# Patient Record
Sex: Female | Born: 1975 | Hispanic: No | Smoking: Never smoker
Health system: Southern US, Community
[De-identification: ages and names within clinical notes are randomized; demographics above are authoritative.]

## PROBLEM LIST (undated history)

## (undated) DIAGNOSIS — I10 Essential (primary) hypertension: Secondary | ICD-10-CM

## (undated) HISTORY — PX: TUBAL LIGATION: SHX77

---

## 2007-04-22 ENCOUNTER — Encounter: Admission: RE | Admit: 2007-04-22 | Discharge: 2007-04-22 | Payer: Self-pay | Admitting: Family Medicine

## 2007-04-27 ENCOUNTER — Other Ambulatory Visit: Admission: RE | Admit: 2007-04-27 | Discharge: 2007-04-27 | Payer: Self-pay | Admitting: Gynecology

## 2010-04-10 ENCOUNTER — Other Ambulatory Visit (HOSPITAL_COMMUNITY)
Admission: RE | Admit: 2010-04-10 | Discharge: 2010-04-10 | Disposition: A | Discharge status: Home / Self Care | Payer: BC Managed Care – PPO | Source: Ambulatory Visit | Attending: Family Medicine | Admitting: Family Medicine

## 2010-04-10 ENCOUNTER — Other Ambulatory Visit: Payer: Self-pay | Admitting: Family Medicine

## 2010-04-10 DIAGNOSIS — Z124 Encounter for screening for malignant neoplasm of cervix: Secondary | ICD-10-CM | POA: Insufficient documentation

## 2010-04-10 DIAGNOSIS — Z1159 Encounter for screening for other viral diseases: Secondary | ICD-10-CM | POA: Insufficient documentation

## 2013-05-09 ENCOUNTER — Other Ambulatory Visit (HOSPITAL_COMMUNITY)
Admission: RE | Admit: 2013-05-09 | Discharge: 2013-05-09 | Disposition: A | Discharge status: Home / Self Care | Payer: BC Managed Care – PPO | Source: Ambulatory Visit | Attending: Family Medicine | Admitting: Family Medicine

## 2013-05-09 ENCOUNTER — Other Ambulatory Visit: Payer: Self-pay | Admitting: Family Medicine

## 2013-05-09 DIAGNOSIS — Z01419 Encounter for gynecological examination (general) (routine) without abnormal findings: Secondary | ICD-10-CM | POA: Insufficient documentation

## 2013-05-09 DIAGNOSIS — Z1151 Encounter for screening for human papillomavirus (HPV): Secondary | ICD-10-CM | POA: Insufficient documentation

## 2013-10-03 ENCOUNTER — Emergency Department (HOSPITAL_BASED_OUTPATIENT_CLINIC_OR_DEPARTMENT_OTHER)
Admission: EM | Admit: 2013-10-03 | Discharge: 2013-10-03 | Disposition: A | Discharge status: Home / Self Care | Payer: BC Managed Care – PPO | Attending: Emergency Medicine | Admitting: Emergency Medicine

## 2013-10-03 ENCOUNTER — Emergency Department (HOSPITAL_BASED_OUTPATIENT_CLINIC_OR_DEPARTMENT_OTHER): Payer: BC Managed Care – PPO

## 2013-10-03 ENCOUNTER — Encounter (HOSPITAL_BASED_OUTPATIENT_CLINIC_OR_DEPARTMENT_OTHER): Payer: Self-pay | Admitting: Emergency Medicine

## 2013-10-03 DIAGNOSIS — Z3202 Encounter for pregnancy test, result negative: Secondary | ICD-10-CM | POA: Insufficient documentation

## 2013-10-03 DIAGNOSIS — I10 Essential (primary) hypertension: Secondary | ICD-10-CM | POA: Insufficient documentation

## 2013-10-03 DIAGNOSIS — Z79899 Other long term (current) drug therapy: Secondary | ICD-10-CM | POA: Insufficient documentation

## 2013-10-03 DIAGNOSIS — R102 Pelvic and perineal pain: Secondary | ICD-10-CM

## 2013-10-03 DIAGNOSIS — R109 Unspecified abdominal pain: Secondary | ICD-10-CM | POA: Insufficient documentation

## 2013-10-03 DIAGNOSIS — N949 Unspecified condition associated with female genital organs and menstrual cycle: Secondary | ICD-10-CM | POA: Insufficient documentation

## 2013-10-03 HISTORY — DX: Essential (primary) hypertension: I10

## 2013-10-03 LAB — URINALYSIS, ROUTINE W REFLEX MICROSCOPIC
Bilirubin Urine: NEGATIVE
Glucose, UA: NEGATIVE mg/dL
Hgb urine dipstick: NEGATIVE
KETONES UR: NEGATIVE mg/dL
LEUKOCYTES UA: NEGATIVE
Nitrite: NEGATIVE
PROTEIN: NEGATIVE mg/dL
Specific Gravity, Urine: 1.018 (ref 1.005–1.030)
UROBILINOGEN UA: 0.2 mg/dL (ref 0.0–1.0)
pH: 6 (ref 5.0–8.0)

## 2013-10-03 LAB — PREGNANCY, URINE: PREG TEST UR: NEGATIVE

## 2013-10-03 MED ORDER — KETOROLAC TROMETHAMINE 60 MG/2ML IM SOLN
INTRAMUSCULAR | Status: AC
  Administered 2013-10-03: 60 mg via INTRAMUSCULAR
  Filled 2013-10-03: qty 2

## 2013-10-03 MED ORDER — KETOROLAC TROMETHAMINE 60 MG/2ML IM SOLN
60.0000 mg | Freq: Once | INTRAMUSCULAR | Status: AC
  Administered 2013-10-03: 60 mg via INTRAMUSCULAR

## 2013-10-03 NOTE — Discharge Instructions (Signed)
Call women's outpatient clinic to arrange a followup appointment. The contact information has been provided in this discharge summary.  Return to the emergency department if you develop worsening pain, high fever , or other new and concerning symptoms.   Pelvic Pain Female pelvic pain can be caused by many different things and start from a variety of places. Pelvic pain refers to pain that is located in the lower half of the abdomen and between your hips. The pain may occur over a short period of time (acute) or may be reoccurring (chronic). The cause of pelvic pain may be related to disorders affecting the female reproductive organs (gynecologic), but it may also be related to the bladder, kidney stones, an intestinal complication, or muscle or skeletal problems. Getting help right away for pelvic pain is important, especially if there has been severe, sharp, or a sudden onset of unusual pain. It is also important to get help right away because some types of pelvic pain can be life threatening.  CAUSES  Below are only some of the causes of pelvic pain. The causes of pelvic pain can be in one of several categories.   Gynecologic.  Pelvic inflammatory disease.  Sexually transmitted infection.  Ovarian cyst or a twisted ovarian ligament (ovarian torsion).  Uterine lining that grows outside the uterus (endometriosis).  Fibroids, cysts, or tumors.  Ovulation.  Pregnancy.  Pregnancy that occurs outside the uterus (ectopic pregnancy).  Miscarriage.  Labor.  Abruption of the placenta or ruptured uterus.  Infection.  Uterine infection (endometritis).  Bladder infection.  Diverticulitis.  Miscarriage related to a uterine infection (septic abortion).  Bladder.  Inflammation of the bladder (cystitis).  Kidney stone(s).  Gastrointestinal.  Constipation.  Diverticulitis.  Neurologic.  Trauma.  Feeling pelvic pain because of mental or emotional causes  (psychosomatic).  Cancers of the bowel or pelvis. EVALUATION  Your caregiver will want to take a careful history of your concerns. This includes recent changes in your health, a careful gynecologic history of your periods (menses), and a sexual history. Obtaining your family history and medical history is also important. Your caregiver may suggest a pelvic exam. A pelvic exam will help identify the location and severity of the pain. It also helps in the evaluation of which organ system may be involved. In order to identify the cause of the pelvic pain and be properly treated, your caregiver may order tests. These tests may include:   A pregnancy test.  Pelvic ultrasonography.  An X-ray exam of the abdomen.  A urinalysis or evaluation of vaginal discharge.  Blood tests. HOME CARE INSTRUCTIONS   Only take over-the-counter or prescription medicines for pain, discomfort, or fever as directed by your caregiver.   Rest as directed by your caregiver.   Eat a balanced diet.   Drink enough fluids to make your urine clear or pale yellow, or as directed.   Avoid sexual intercourse if it causes pain.   Apply warm or cold compresses to the lower abdomen depending on which one helps the pain.   Avoid stressful situations.   Keep a journal of your pelvic pain. Write down when it started, where the pain is located, and if there are things that seem to be associated with the pain, such as food or your menstrual cycle.  Follow up with your caregiver as directed.  SEEK MEDICAL CARE IF:  Your medicine does not help your pain.  You have abnormal vaginal discharge. SEEK IMMEDIATE MEDICAL CARE IF:   You have  heavy bleeding from the vagina.   Your pelvic pain increases.   You feel light-headed or faint.   You have chills.   You have pain with urination or blood in your urine.   You have uncontrolled diarrhea or vomiting.   You have a fever or persistent symptoms for more  than 3 days.  You have a fever and your symptoms suddenly get worse.   You are being physically or sexually abused.  MAKE SURE YOU:  Understand these instructions.  Will watch your condition.  Will get help if you are not doing well or get worse. Document Released: 01/14/2004 Document Revised: 07/03/2013 Document Reviewed: 06/08/2011 [facility] Patient Information 2015 [place], [place]. This information is not intended to replace advice given to you by your health care provider. Make sure you discuss any questions you have with your health care provider.

## 2013-10-03 NOTE — ED Notes (Addendum)
Pt. Resting quietly in nad.

## 2013-10-03 NOTE — ED Notes (Signed)
Pt states had period yesterday  That was very heavy. States no bleeding today but feels like she is having contractions every 5 minutes. Pt states no bleeding today.

## 2013-10-03 NOTE — ED Provider Notes (Signed)
CSN: [id number]     Arrival date & time 10/03/13  1024 History   First MD Initiated Contact with Patient 10/03/13 1116     Chief Complaint  Patient presents with  . Abdominal Pain     (Consider location/radiation/quality/duration/timing/severity/associated sxs/prior Treatment) HPI Comments: Patient is a 38 year old female who presents with complaints of suprapubic abdominal discomfort that has been occurring since yesterday. She states that she had a heavy period at the normal time yesterday and this preceded her cramping. Her bleeding has since resolved. She denies any discharge, fevers, difficulty urinating. She reports having a tubal ligation years ago.  Patient is a 38 y.o. female presenting with abdominal pain. The history is provided by the patient.  Abdominal Pain Pain location:  Suprapubic Pain quality: cramping   Pain radiates to:  Does not radiate Pain severity:  Severe Onset quality:  Sudden Duration:  1 day Timing:  Intermittent Progression:  Worsening Chronicity:  New Relieved by:  Nothing Worsened by:  Nothing tried Ineffective treatments:  NSAIDs   Past Medical History  Diagnosis Date  . Hypertension    Past Surgical History  Procedure Laterality Date  . Tubal ligation    . Cesarean section      X4   No family history on file. History  Substance Use Topics  . Smoking status: Never Smoker   . Smokeless tobacco: Not on file  . Alcohol Use: Yes     Comment: occ   OB History   Grav Para Term Preterm Abortions TAB SAB Ect Mult Living                 Review of Systems  Gastrointestinal: Positive for abdominal pain.  All other systems reviewed and are negative.     Allergies  Morphine and related and Sulfa antibiotics  Home Medications   Prior to Admission medications   Medication Sig Start Date End Date Taking? Authorizing Provider  hydrochlorothiazide (HYDRODIURIL) 25 MG tablet Take 25 mg by mouth daily.   Yes Historical Provider, MD   BP  139/94  Temp(Src) 98 F (36.7 C) (Oral)  Resp 16  Wt 170 lb (77.111 kg)  SpO2 100%  LMP 10/02/2013 Physical Exam  Nursing note and vitals reviewed. Constitutional: She is oriented to person, place, and time. She appears well-developed and well-nourished. No distress.  HENT:  Head: Normocephalic and atraumatic.  Neck: Normal range of motion. Neck supple.  Cardiovascular: Normal rate and regular rhythm.  Exam reveals no gallop and no friction rub.   No murmur heard. Pulmonary/Chest: Effort normal and breath sounds normal. No respiratory distress. She has no wheezes.  Abdominal: Soft. Bowel sounds are normal. She exhibits no distension. There is tenderness.  There is tenderness to palpation in the suprapubic region with no rebound and no guarding. Bowel sounds are present.  Musculoskeletal: Normal range of motion.  Neurological: She is alert and oriented to person, place, and time.  Skin: Skin is warm and dry. She is not diaphoretic.    ED Course  Procedures (including critical care time) Labs Review Labs Reviewed  PREGNANCY, URINE  URINALYSIS, ROUTINE W REFLEX MICROSCOPIC    Imaging Review No results found.   EKG Interpretation None      MDM   Final diagnoses:  None    Patient is a 38 year old female who presents with suprapubic cramping. She started her period yesterday and this discomfort shortly ensued. Workup reveals a clear urinalysis. Ultrasound of the pelvis reveals a large amount of complex  fluid within the endometrial canal. There is also echogenic material and or mass noted measuring up to 3.6 cm. Her pregnancy test is negative. Patient appears to be at considerable degree of discomfort and pain was unrelieved with Toradol. She has adamantly refused any other stronger pain medication.  I discussed the case with Dr. Debroah Loop from GYN who recommends followup in the office. She will be given the contact information for women's outpatient clinic. She is to call to  arrange a followup in the next week.    Geoffery Lyons, MD 10/03/13 [phone]

## 2013-10-04 ENCOUNTER — Other Ambulatory Visit: Payer: Self-pay | Admitting: Obstetrics & Gynecology

## 2015-04-25 IMAGING — US US PELVIS COMPLETE
1 series · 13 of 25 positions shown · non-contrast
Comparison: None

CLINICAL DATA: Cramping and pain.  Heavy bleeding.

EXAM:
TRANSABDOMINAL AND TRANSVAGINAL ULTRASOUND OF PELVIS
TECHNIQUE: Both transabdominal and transvaginal ultrasound examinations of the
pelvis were performed. Transabdominal technique was performed for
global imaging of the pelvis including uterus, ovaries, adnexal
regions, and pelvic cul-de-sac. It was necessary to proceed with
endovaginal exam following the transabdominal exam to visualize the
uterus and ovaries..

[Series 1: us pelvis complete · 0.30mm/px · 61 acquisitions, 13 frames shown]
[im 1/61]
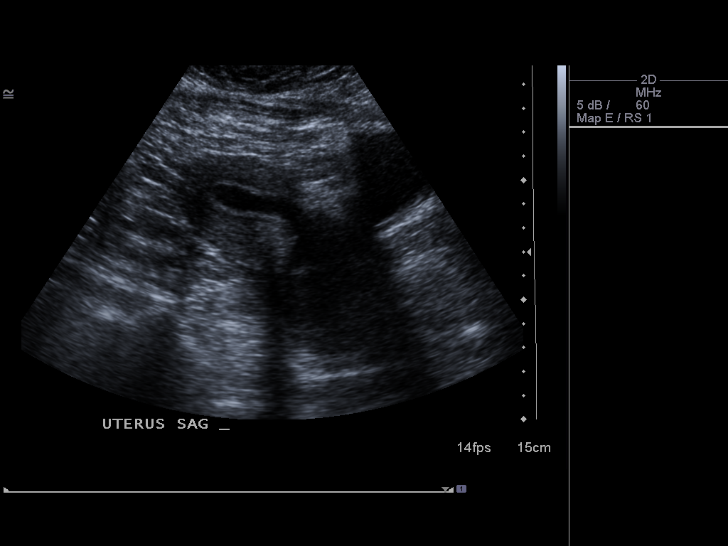
[im 6/61]
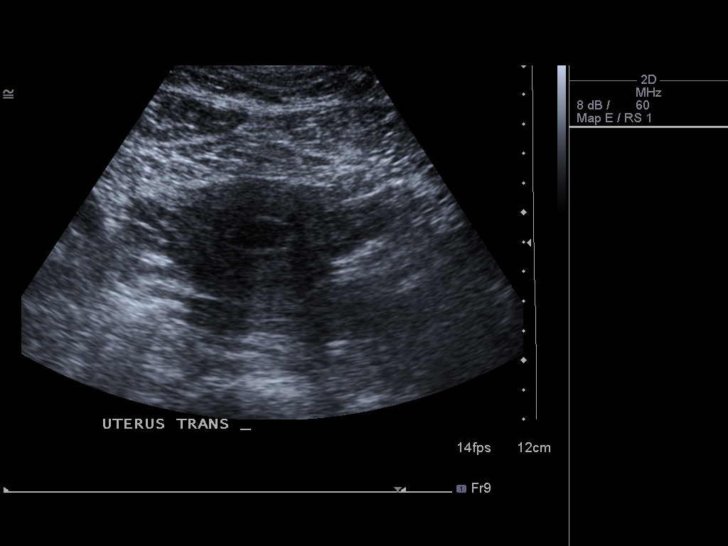
[im 11/61]
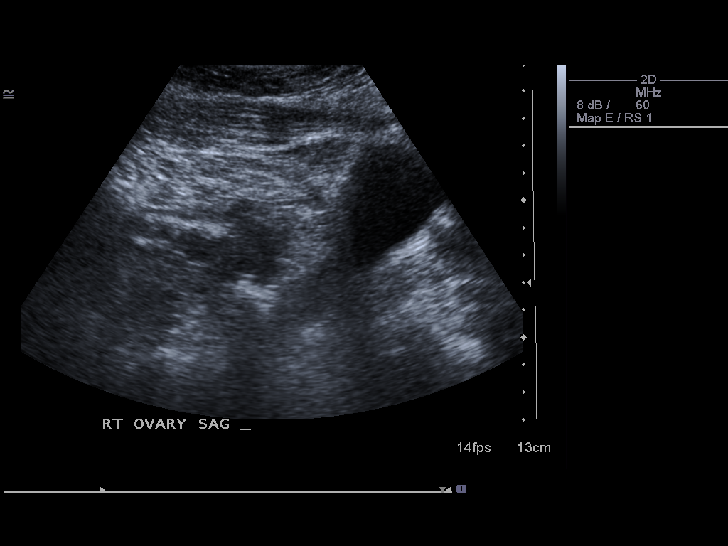
[im 16/61]
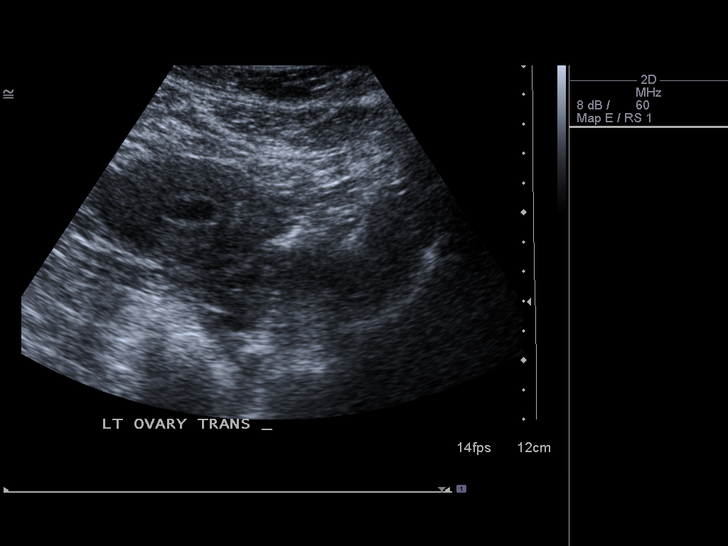
[im 21/61]
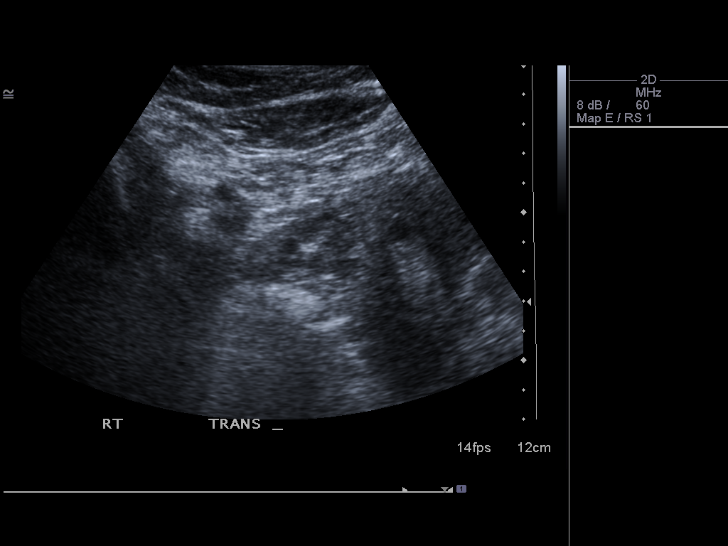
[im 26/61]
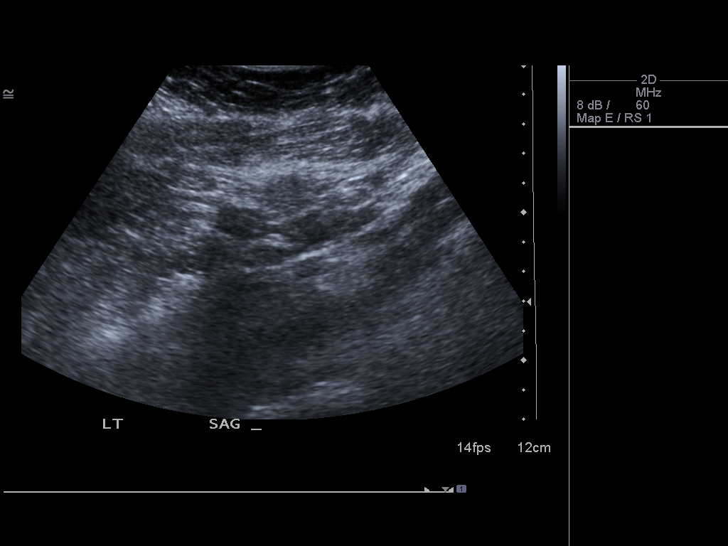
[im 31/61]
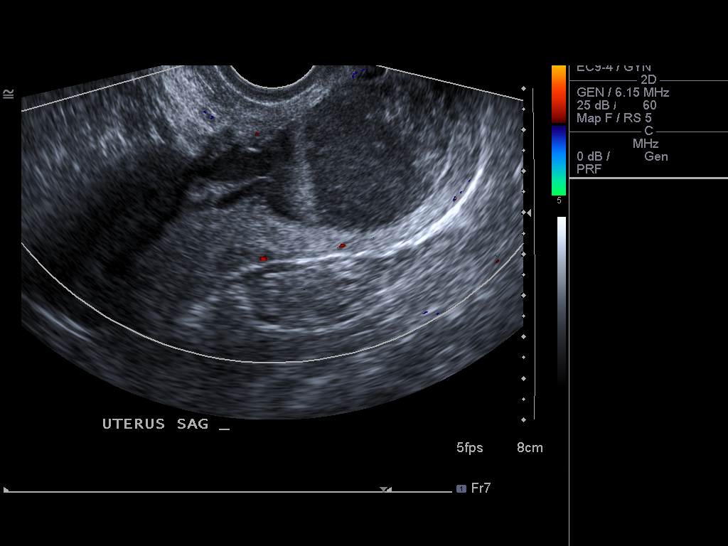
[im 36/61]
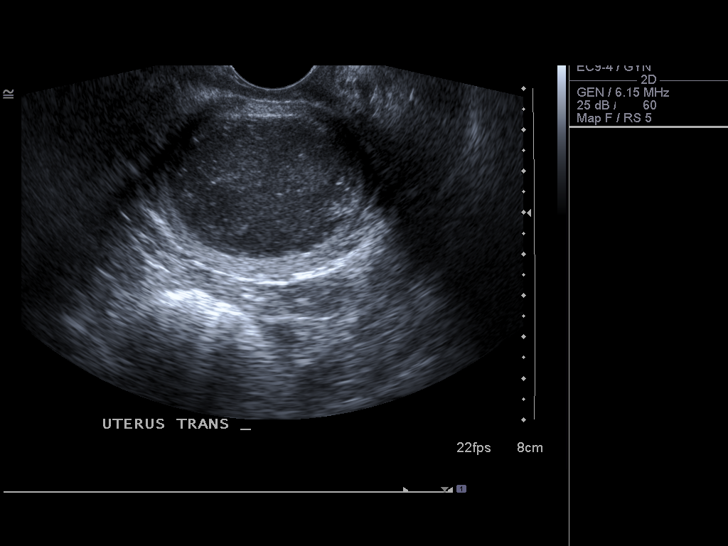
[im 41/61]
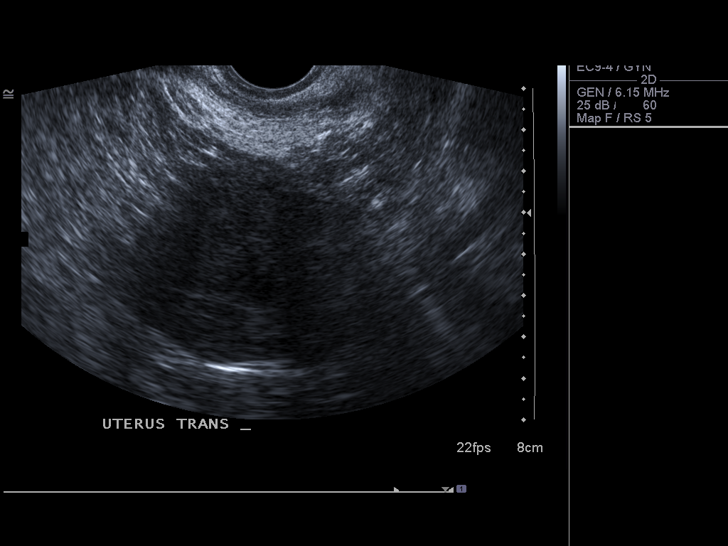
[im 46/61]
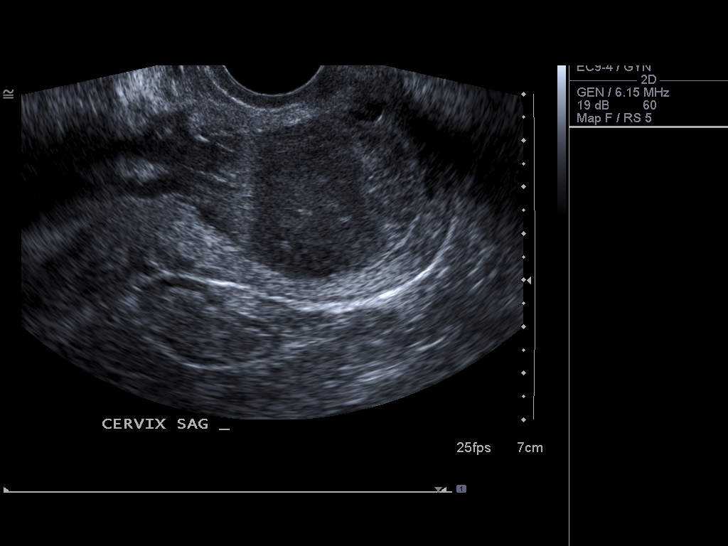
[im 51/61]
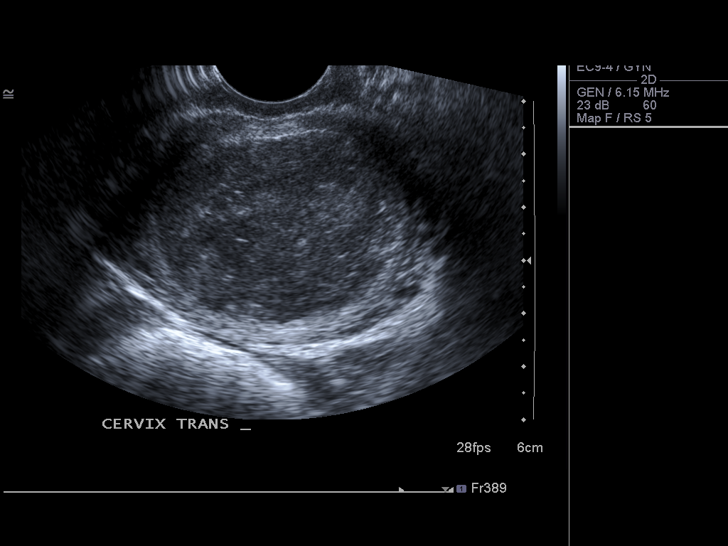
[im 56/61]
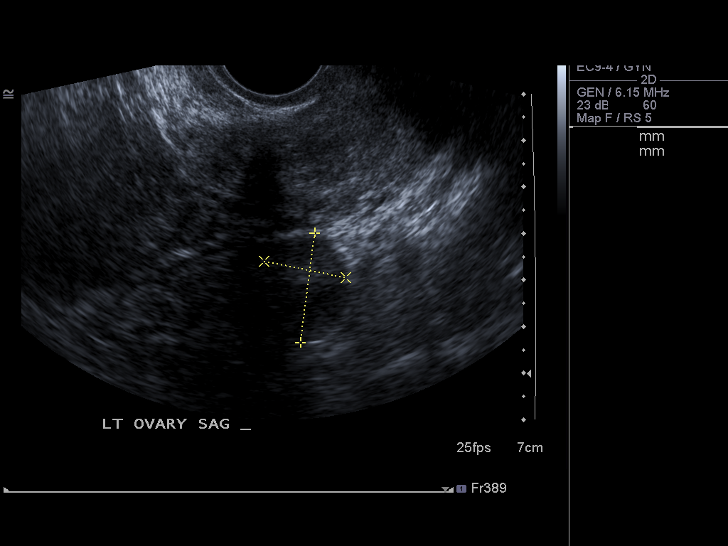
[im 61/61]
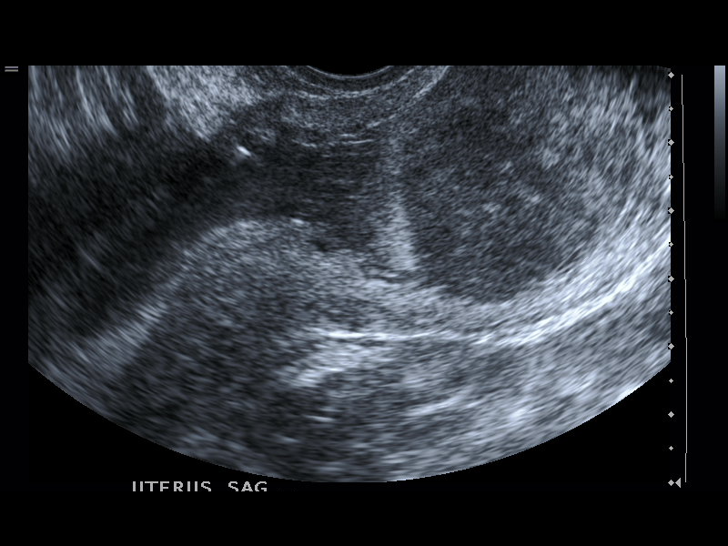

[13 of 25 positions shown; findings below may reference images not displayed]

FINDINGS: Uterus

Measurements: 11.3 x 4.3 x 6.1 cm.. No fibroids or other mass
visualized.

Endometrium

Large amount of complex fluid noted in the endometrial canal.
Echogenic material and/or mass is noted within the canal. Fluid and
echogenic material noted in the lower uterine segment. Echogenic
material and/or solid mass measures up to 3.6 cm in AP diameter.
Please correlate with pregnancy test.

Right ovary

Measurements: 2.3 x 1.9 x 1.8 cm. Normal appearance/no adnexal mass.

Left ovary

Measurements: 2.4 x 1.8 x 1.6 cm. Normal appearance/no adnexal mass.

Other findings

No free fluid.
IMPRESSION: Large amount of complex fluid containing echogenic material noted in
the endometrial canal including in the lower uterine segment.
Hemorrhage and/or a solid lesion including tumor could present in
this fashion.
